# Patient Record
Sex: Female | Born: 1955 | Race: White | Hispanic: No | Marital: Single
Health system: Southern US, Community
[De-identification: ages and names within clinical notes are randomized; demographics above are authoritative.]

---

## 2019-04-28 ENCOUNTER — Other Ambulatory Visit: Payer: Self-pay

## 2019-04-28 ENCOUNTER — Emergency Department
Admission: EM | Admit: 2019-04-28 | Discharge: 2019-04-28 | Disposition: A | Discharge status: Home / Self Care | Payer: BC Managed Care – PPO | Attending: Emergency Medicine | Admitting: Emergency Medicine

## 2019-04-28 ENCOUNTER — Emergency Department: Payer: BC Managed Care – PPO

## 2019-04-28 DIAGNOSIS — S8262XA Displaced fracture of lateral malleolus of left fibula, initial encounter for closed fracture: Secondary | ICD-10-CM | POA: Diagnosis not present

## 2019-04-28 DIAGNOSIS — W108XXA Fall (on) (from) other stairs and steps, initial encounter: Secondary | ICD-10-CM | POA: Diagnosis not present

## 2019-04-28 DIAGNOSIS — Y999 Unspecified external cause status: Secondary | ICD-10-CM | POA: Insufficient documentation

## 2019-04-28 DIAGNOSIS — Y9389 Activity, other specified: Secondary | ICD-10-CM | POA: Diagnosis not present

## 2019-04-28 DIAGNOSIS — S99812A Other specified injuries of left ankle, initial encounter: Secondary | ICD-10-CM | POA: Diagnosis present

## 2019-04-28 DIAGNOSIS — Y929 Unspecified place or not applicable: Secondary | ICD-10-CM | POA: Diagnosis not present

## 2019-04-28 DIAGNOSIS — S82892A Other fracture of left lower leg, initial encounter for closed fracture: Secondary | ICD-10-CM

## 2019-04-28 MED ORDER — OXYCODONE-ACETAMINOPHEN 5-325 MG PO TABS
1.0000 | ORAL_TABLET | Freq: Once | ORAL | Status: AC
  Administered 2019-04-28: 19:00:00 1 via ORAL
  Filled 2019-04-28: qty 1

## 2019-04-28 MED ORDER — OXYCODONE-ACETAMINOPHEN 5-325 MG PO TABS: 1.0000 | ORAL_TABLET | Freq: Four times a day (QID) | ORAL | 0 refills | Status: AC | PRN

## 2019-04-28 MED ORDER — ONDANSETRON 4 MG PO TBDP
4.0000 mg | ORAL_TABLET | Freq: Once | ORAL | Status: AC
  Administered 2019-04-28: 4 mg via ORAL
  Filled 2019-04-28: qty 1

## 2019-04-28 NOTE — ED Provider Notes (Signed)
[facility] Emergency Department Provider Note  ____________________________________________  Time seen: Approximately 7:17 PM  I have reviewed the triage vital signs and the nursing notes.   HISTORY  Chief Complaint Ankle Pain    HPI Kelly Carroll is a 63 y.o. female presents to the emergency department with acute left ankle pain after sustaining an inversion type ankle injury while trying to walk down steps.  Patient states that she has been unable to bear weight since injury occurred.  No numbness or tingling of the left foot.  She did not hit her head or her neck.  No other alleviating measures have been attempted.        No past medical history on file.  There are no active problems to display for this patient.    Prior to Admission medications   Medication Sig Start Date End Date Taking? Authorizing Provider  oxyCODONE-acetaminophen (PERCOCET/ROXICET) 5-325 MG tablet Take 1 tablet by mouth every 6 (six) hours as needed for up to 3 days. 04/28/19 05/01/19  Orvil Feil, PA-C    Allergies Patient has no known allergies.  No family history on file.  Social History Social History   Tobacco Use  . Smoking status: Not on file  Substance Use Topics  . Alcohol use: Not on file  . Drug use: Not on file     Review of Systems  Constitutional: No fever/chills Eyes: No visual changes. No discharge ENT: No upper respiratory complaints. Cardiovascular: no chest pain. Respiratory: no cough. No SOB. Gastrointestinal: No abdominal pain.  No nausea, no vomiting.  No diarrhea.  No constipation. Musculoskeletal: Patient has left ankle pain.  Skin: Negative for rash, abrasions, lacerations, ecchymosis. Neurological: Negative for headaches, focal weakness or numbness.   ____________________________________________   PHYSICAL EXAM:  VITAL SIGNS: ED Triage Vitals  Enc Vitals Group     BP 04/28/19 1816 (!) 141/86     Pulse Rate 04/28/19 1816  72     Resp 04/28/19 1816 18     Temp 04/28/19 1816 99.1 F (37.3 C)     Temp Source 04/28/19 1816 Oral     SpO2 04/28/19 1816 100 %     Weight 04/28/19 1817 146 lb (66.2 kg)     Height 04/28/19 1817 5' 7.5" (1.715 m)     Head Circumference --      Peak Flow --      Pain Score 04/28/19 1817 10     Pain Loc --      Pain Edu? --      Excl. in GC? --      Constitutional: Alert and oriented. Well appearing and in no acute distress. Eyes: Conjunctivae are normal. PERRL. EOMI. Head: Atraumatic. Cardiovascular: Normal rate, regular rhythm. Normal S1 and S2.  Good peripheral circulation. Respiratory: Normal respiratory effort without tachypnea or retractions. Lungs CTAB. Good air entry to the bases with no decreased or absent breath sounds. Musculoskeletal: Patient is unable to perform full range of motion at the left ankle, likely secondary to pain.  She is able to move all 5 left toes.  She has swelling and tenderness to palpation over the lateral malleolus.  Palpable dorsalis pedis pulse, left. Neurologic:  Normal speech and language. No gross focal neurologic deficits are appreciated.  Skin:  Skin is warm, dry and intact. No rash noted. Psychiatric: Mood and affect are normal. Speech and behavior are normal. Patient exhibits appropriate insight and judgement.   ____________________________________________   LABS (all labs ordered  are listed, but only abnormal results are displayed)  Labs Reviewed - No data to display ____________________________________________  EKG   ____________________________________________  RADIOLOGY I personally viewed and evaluated these images as part of my medical decision making, as well as reviewing the written report by the radiologist.  Dg Ankle Complete Left  Result Date: 04/28/2019 CLINICAL DATA:  Twisted ankle going down steps. EXAM: LEFT ANKLE COMPLETE - 3+ VIEW COMPARISON:  None FINDINGS: The lateral ankle swelling identified. Obliquely  oriented intra-articular fracture of the distal fibula is identified. There is mild lateral and posterior displacement of the distal fracture fragments. No additional fracture or subluxation. IMPRESSION: 1. Acute, intra-articular fracture of the distal fibula with overlying soft tissue swelling. Fracture fragments are in near anatomic alignment. Electronically Signed   By: Kerby Moors M.D.   On: 04/28/2019 18:46    ____________________________________________    PROCEDURES  Procedure(s) performed:    Procedures    Medications  oxyCODONE-acetaminophen (PERCOCET/ROXICET) 5-325 MG per tablet 1 tablet (1 tablet Oral Given 04/28/19 1923)  ondansetron (ZOFRAN-ODT) disintegrating tablet 4 mg (4 mg Oral Given 04/28/19 1923)     ____________________________________________   INITIAL IMPRESSION / ASSESSMENT AND PLAN / ED COURSE  Pertinent labs & imaging results that were available during my care of the patient were reviewed by me and considered in my medical decision making (see chart for details).  Review of the [place] CSRS was performed in accordance of the [place] prior to dispensing any controlled drugs.           Assessment and plan Ankle pain 63 year old female presents to the emergency department with acute left ankle pain after an inversion type ankle injury.  Patient was mildly hypertensive at triage but vital signs were otherwise reassuring.  Patient had tenderness and pain to palpation along the lateral malleolus.  X-ray examination of the left ankle revealed a comminuted and minimally displaced lateral malleolus fracture.  Patient was placed in a splint.  She was discharged with Percocet and advised to follow-up with orthopedics.  All patient questions were answered.     ____________________________________________  FINAL CLINICAL IMPRESSION(S) / ED DIAGNOSES  Final diagnoses:  Closed fracture of left ankle, initial encounter      NEW MEDICATIONS STARTED DURING THIS  VISIT:  ED Discharge Orders         Ordered    oxyCODONE-acetaminophen (PERCOCET/ROXICET) 5-325 MG tablet  Every 6 hours PRN     04/28/19 2001              This chart was dictated using voice recognition software/Dragon. Despite best efforts to proofread, errors can occur which can change the meaning. Any change was purely unintentional.    Karren Cobble 04/28/19 2013    Earleen Newport, MD 04/28/19 2016

## 2019-04-28 NOTE — ED Triage Notes (Signed)
Twisted L ankle going down steps just prior to arrival

## 2020-08-30 IMAGING — DX DG ANKLE COMPLETE 3+V*L*
3 series · 3 of 3 positions shown · non-contrast
Comparison: None

CLINICAL DATA: Twisted ankle going down steps.

EXAM:
LEFT ANKLE COMPLETE - 3+ VIEW

[ankle ap]
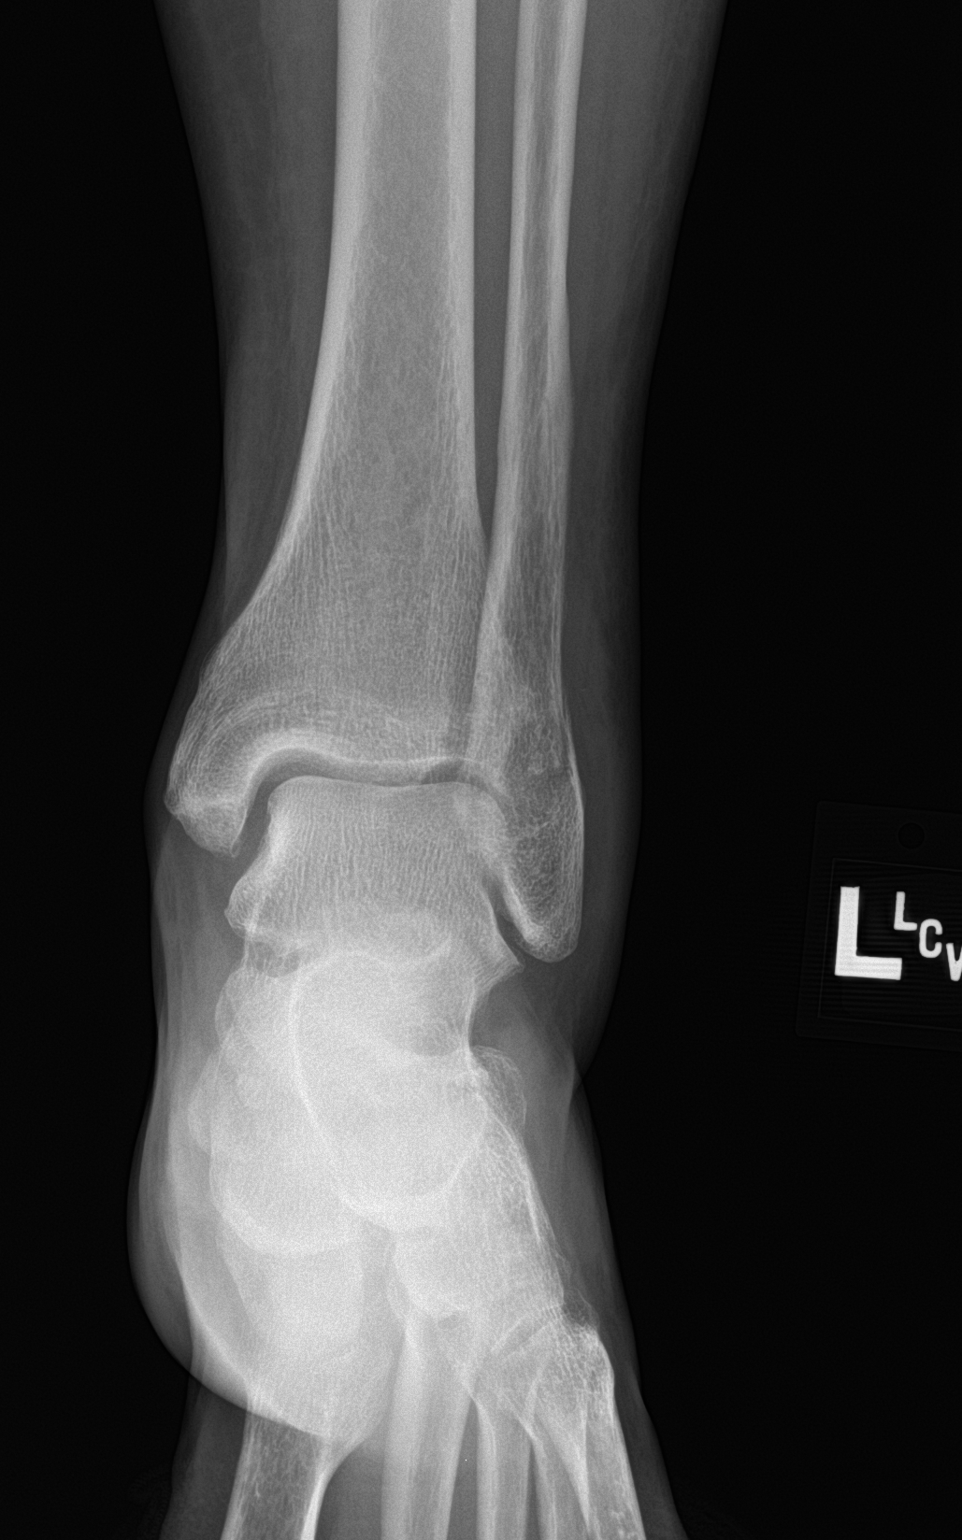

[ankle obl]
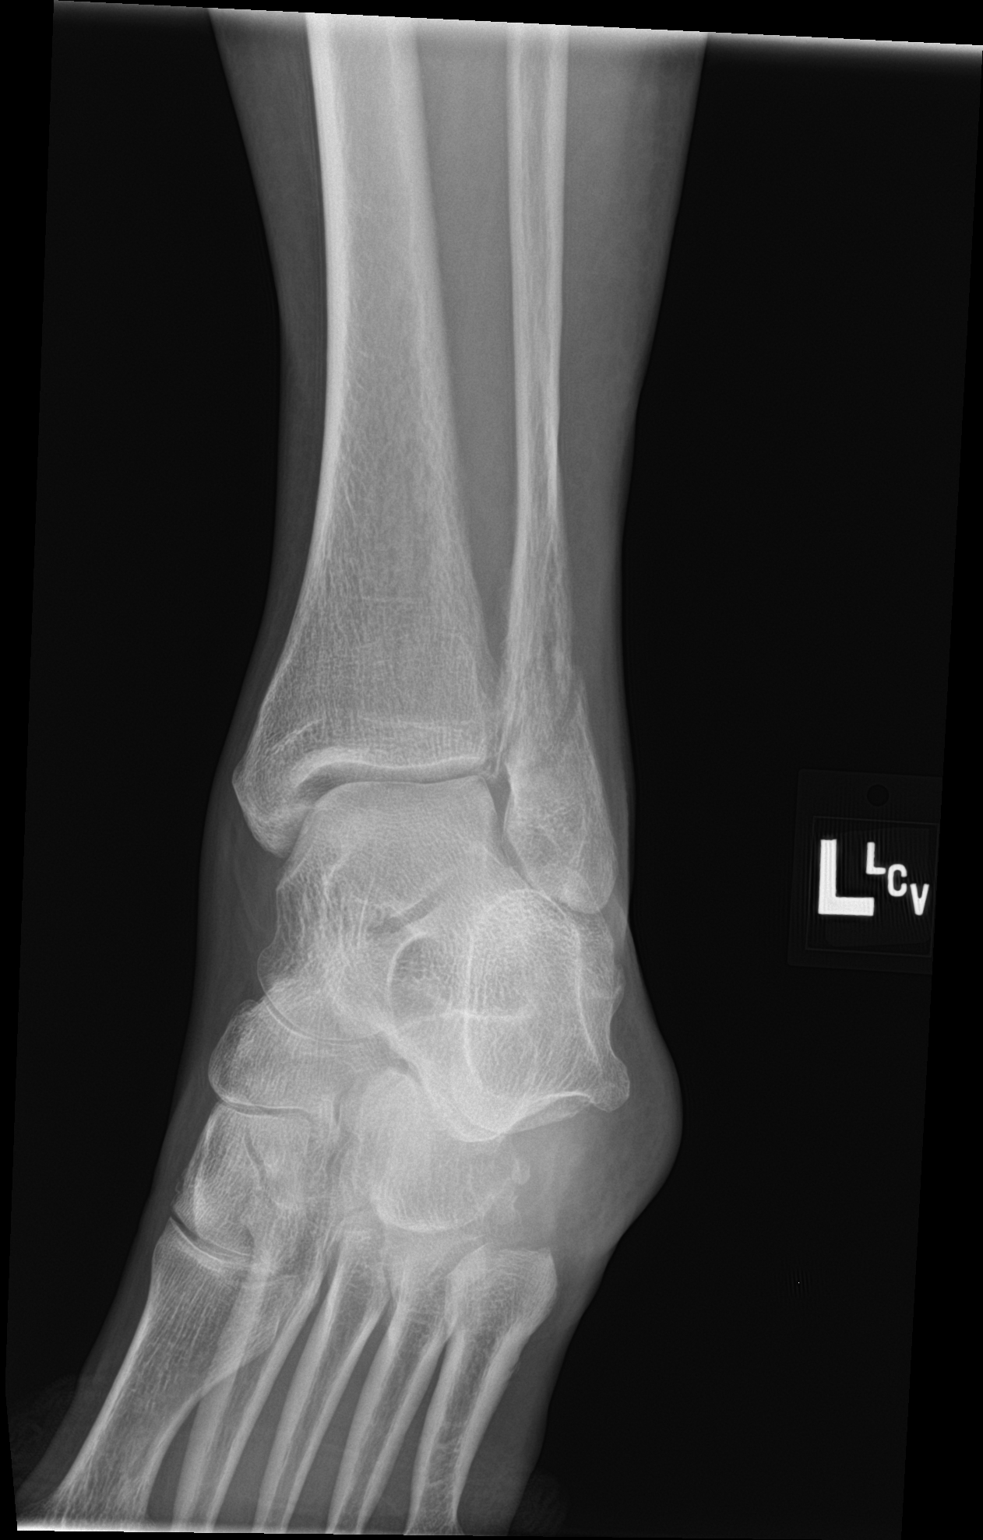

[ankle lat]
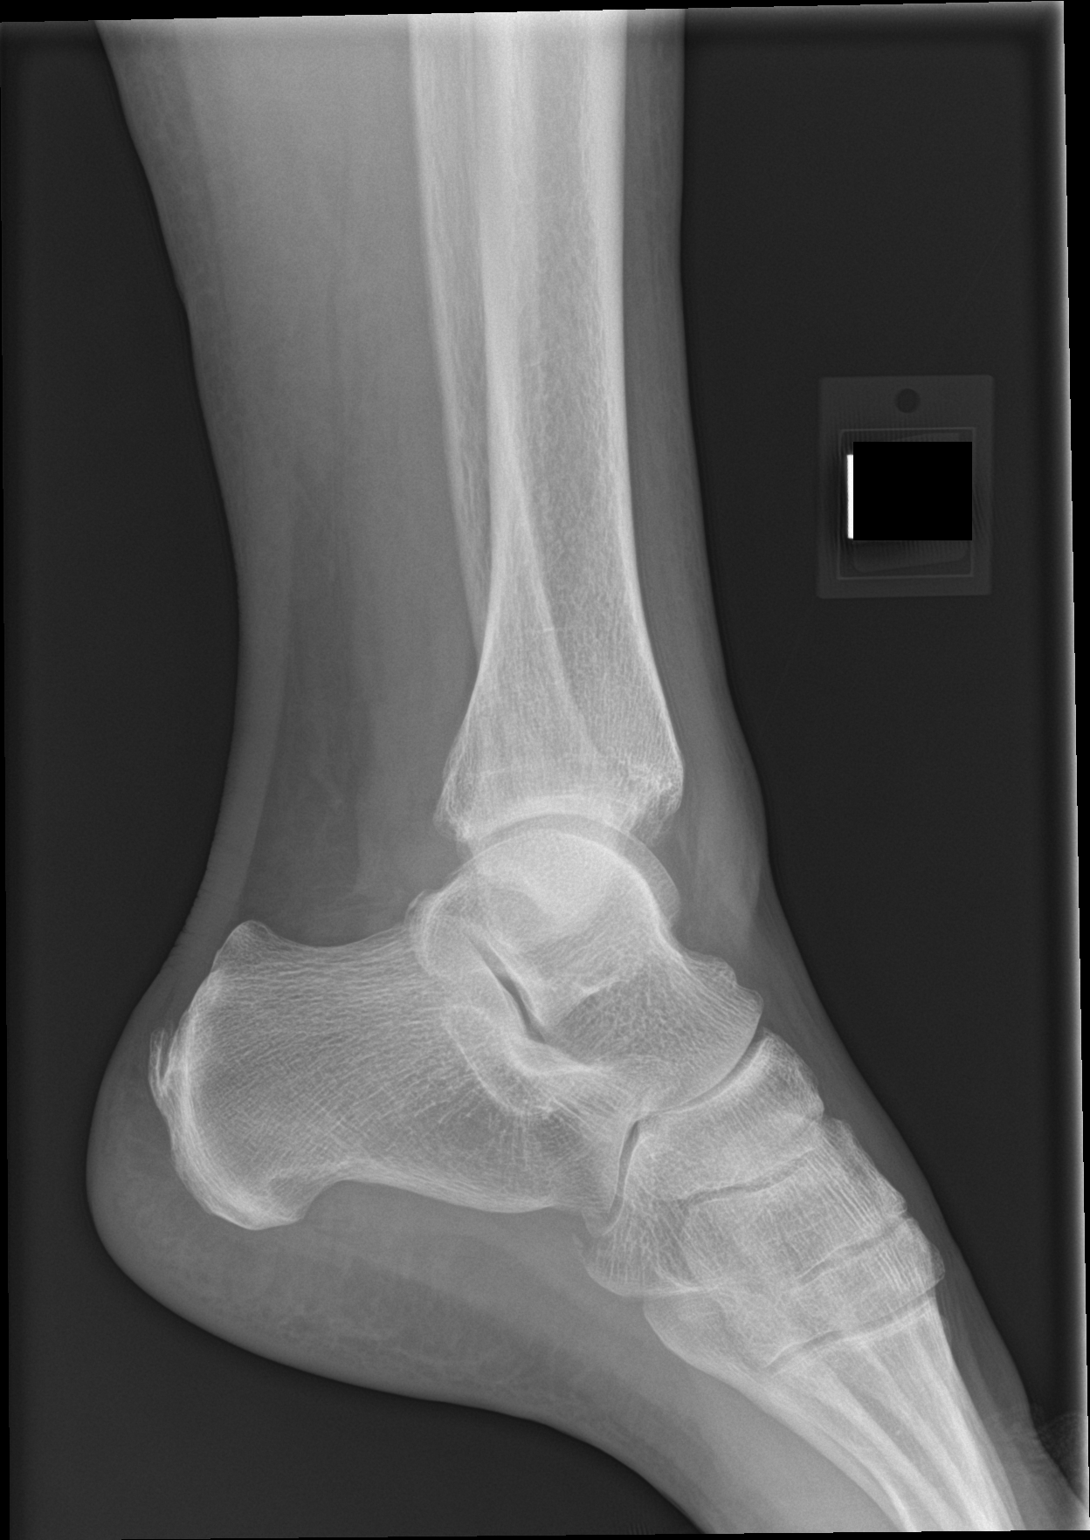

[3 of 3 positions shown; findings below may reference images not displayed]

FINDINGS: The lateral ankle swelling identified. Obliquely oriented
intra-articular fracture of the distal fibula is identified. There
is mild lateral and posterior displacement of the distal fracture
fragments. No additional fracture or subluxation.
IMPRESSION: 1. Acute, intra-articular fracture of the distal fibula with
overlying soft tissue swelling. Fracture fragments are in near
anatomic alignment.
